# Patient Record
Sex: Female | Born: 1990 | Race: Black or African American | Hispanic: No | Marital: Single | Smoking: Current every day smoker
Health system: Southern US, Community
[De-identification: ages and names within clinical notes are randomized; demographics above are authoritative.]

---

## 2018-01-28 ENCOUNTER — Emergency Department (HOSPITAL_COMMUNITY)
Admission: EM | Admit: 2018-01-28 | Discharge: 2018-01-28 | Disposition: A | Discharge status: Home / Self Care | Payer: Self-pay | Attending: Emergency Medicine | Admitting: Emergency Medicine

## 2018-01-28 ENCOUNTER — Encounter (HOSPITAL_COMMUNITY): Payer: Self-pay | Admitting: Emergency Medicine

## 2018-01-28 DIAGNOSIS — N912 Amenorrhea, unspecified: Secondary | ICD-10-CM | POA: Insufficient documentation

## 2018-01-28 DIAGNOSIS — F17210 Nicotine dependence, cigarettes, uncomplicated: Secondary | ICD-10-CM | POA: Insufficient documentation

## 2018-01-28 LAB — I-STAT BETA HCG BLOOD, ED (MC, WL, AP ONLY)

## 2018-01-28 NOTE — ED Triage Notes (Signed)
Pt reports that she hasnt had a menstrual period since December. Reports that she always was regular before then. Reports only been sexually active with female partner.

## 2018-01-28 NOTE — Discharge Instructions (Signed)
Please follow up with OBGYN and/or a primary doctor - referrals have been provided

## 2018-01-28 NOTE — ED Provider Notes (Signed)
[facility]-EMERGENCY DEPT Provider Note   CSN: [id number] Arrival date & time: 01/28/18  1305     History   Chief Complaint Chief Complaint  Patient presents with  . Amenorrhea    HPI Sarah Thomas is a 27 y.o. female who presents with amenorrhea.  She states that for the past 3-4 months she has not had a period.  She had menarche at around age 45 and has had regular periods although they have been heavy.  She has never been on birth control for any reason.  She states she is a lesbian and is in a monogamous relationship with her female partner.  She has never had sex with males.  She has had some weight gain over the past year but attributes this to eating a lot more.  She does not do a significant amount of exercise.  She denies galactorrhea, hot flashes, issues with acne or hirsutism.  She has never had a Pap smear or seen OB/GYN because she states that she thought she did not need to because she is a lesbian.  She denies any pain.  HPI  History reviewed. No pertinent past medical history.  There are no active problems to display for this patient.   History reviewed. No pertinent surgical history.   OB History   None      Home Medications    Prior to Admission medications   Not on File    Family History No family history on file.  Social History Social History   Tobacco Use  . Smoking status: Current Every Day Smoker    Types: Cigars  . Smokeless tobacco: Never Used  Substance Use Topics  . Alcohol use: Not on file  . Drug use: Not on file     Allergies   Patient has no known allergies.   Review of Systems Review of Systems  Constitutional: Positive for unexpected weight change (Weight gain).  Gastrointestinal: Negative for abdominal pain.  Genitourinary: Positive for menstrual problem. Negative for dysuria, vaginal bleeding, vaginal discharge and vaginal pain.  All other systems reviewed and are negative.    Physical  Exam Updated Vital Signs BP 119/74 (BP Location: Left Arm)   Pulse 75   Temp 98.6 F (37 C) (Oral)   Resp 17   LMP 10/13/2017   SpO2 100%   Physical Exam  Constitutional: She is oriented to person, place, and time. She appears well-developed and well-nourished. No distress.  Overweight female in no acute distress  HENT:  Head: Normocephalic and atraumatic.  Eyes: Pupils are equal, round, and reactive to light. Conjunctivae are normal. Right eye exhibits no discharge. Left eye exhibits no discharge. No scleral icterus.  Neck: Normal range of motion.  Cardiovascular: Normal rate and regular rhythm.  Pulmonary/Chest: Effort normal and breath sounds normal. No respiratory distress.  Abdominal: Soft. Bowel sounds are normal. She exhibits no distension. There is no tenderness.  Neurological: She is alert and oriented to person, place, and time.  Skin: Skin is warm and dry.  Psychiatric: She has a normal mood and affect. Her behavior is normal.  Nursing note and vitals reviewed.    ED Treatments / Results  Labs (all labs ordered are listed, but only abnormal results are displayed) Labs Reviewed  I-STAT BETA HCG BLOOD, ED ([facility], [facility], [facility] ONLY)    EKG None  Radiology No results found.  Procedures Procedures (including critical care time)  Medications Ordered in ED Medications - No data to display  Initial Impression / Assessment and Plan / ED Course  I have reviewed the triage vital signs and the nursing notes.  Pertinent labs & imaging results that were available during my care of the patient were reviewed by me and considered in my medical decision making (see chart for details).  27 year old female presents with secondary amenorrhea for the past 3-4 months.  Her vital signs are normal.  She is well-appearing.  She has no obvious reason for secondary amenorrhea and her history although I do think she needs further testing with lab work and a Pap smear as an outpatient.  She  was given referrals to women's health and [facility] [facility] since she is uninsured.  She verbalized understanding and is in agreement with plan.  Final Clinical Impressions(s) / ED Diagnoses   Final diagnoses:  Amenorrhea    ED Discharge Orders    None       Bethel BornGekas, Demitris Pokorny Marie, PA-C 01/28/18 1538    Gwyneth SproutPlunkett, Whitney, MD 01/28/18 2056

## 2019-10-07 ENCOUNTER — Other Ambulatory Visit: Payer: Self-pay

## 2019-10-07 ENCOUNTER — Emergency Department (HOSPITAL_COMMUNITY)
Admission: EM | Admit: 2019-10-07 | Discharge: 2019-10-08 | Disposition: A | Discharge status: Home / Self Care | Payer: Self-pay | Attending: Emergency Medicine | Admitting: Emergency Medicine

## 2019-10-07 ENCOUNTER — Encounter (HOSPITAL_COMMUNITY): Payer: Self-pay | Admitting: Emergency Medicine

## 2019-10-07 DIAGNOSIS — K0889 Other specified disorders of teeth and supporting structures: Secondary | ICD-10-CM | POA: Insufficient documentation

## 2019-10-07 DIAGNOSIS — F17210 Nicotine dependence, cigarettes, uncomplicated: Secondary | ICD-10-CM | POA: Insufficient documentation

## 2019-10-07 MED ORDER — OXYCODONE-ACETAMINOPHEN 5-325 MG PO TABS
1.0000 | ORAL_TABLET | ORAL | Status: DC | PRN
  Administered 2019-10-07: 1 via ORAL
  Filled 2019-10-07: qty 1

## 2019-10-07 NOTE — ED Triage Notes (Signed)
Patient reports left upper and lower molar pain for 2 days , dental appointment tomorrow but can no longer wait .

## 2019-10-08 NOTE — ED Provider Notes (Signed)
[facility] EMERGENCY DEPARTMENT Provider Note   CSN: [id number] Arrival date & time: 10/07/19  2136     History   Chief Complaint Chief Complaint  Patient presents with  . Dental Pain    HPI Sarah Thomas is a 28 y.o. female.  Patient presents to the emergency department with a dental complaint. Symptoms began 2 days ago. The patient has tried to alleviate pain by not eating on it and taking pain meds in triage.  Pain rated as moderate to severe, characterized as throbbing in nature and located left upper and lower rear molars. Has a dentist appointment for tomorrow.     HPI  History reviewed. No pertinent past medical history.  There are no problems to display for this patient.   History reviewed. No pertinent surgical history.   OB History   No obstetric history on file.      Home Medications    Prior to Admission medications   Not on File    Family History No family history on file.  Social History Social History   Tobacco Use  . Smoking status: Current Every Day Smoker    Types: Cigars  . Smokeless tobacco: Never Used  Substance Use Topics  . Alcohol use: Never  . Drug use: Never     Allergies   Patient has no known allergies.   Review of Systems Review of Systems   Physical Exam Updated Vital Signs BP 99/71 (BP Location: Right Arm)   Pulse 65   Temp 98 F (36.7 C) (Oral)   Resp 16   LMP 09/30/2019   SpO2 100%   Physical Exam Physical Exam  Constitutional: Pt appears well-developed and well-nourished.  HENT:  Head: Normocephalic.  Mouth/Throat:  Crowding of left upper and lower rear molars No gingival swelling, fluctuance or induration No gross abscess  No sublingual edema, no sign of Ludwig's angina, or deep space infection Eyes: Conjunctivae are normal.  Neck: Normal range of motion. Neck supple.  No stridor Handling secretions without difficulty No nuchal rigidity No cervical  lymphadenopathy Pulmonary/Chest: Effort normal. No respiratory distress.  Equal chest rise  Neurological: Pt is alert and oriented x 4  Skin: Skin is warm and dry.  Psychiatric: Pt has a normal mood and affect.  Nursing note and vitals reviewed.   ED Treatments / Results  Labs (all labs ordered are listed, but only abnormal results are displayed) Labs Reviewed - No data to display  EKG    Radiology No results found.  Procedures Procedures (including critical care time)  Medications Ordered in ED Medications  oxyCODONE-acetaminophen (PERCOCET/ROXICET) 5-325 MG per tablet 1 tablet (1 tablet Oral Given 10/07/19 2152)     Initial Impression / Assessment and Plan / ED Course  I have reviewed the triage vital signs and the nursing notes.  Pertinent labs & imaging results that were available during my care of the patient were reviewed by me and considered in my medical decision making (see chart for details).        Patient with dentalgia.  No abscess requiring immediate incision and drainage.  Exam not concerning for Ludwig's angina or pharyngeal abscess. No evidence of infection. Pt instructed to follow-up with dentist.  Discussed return precautions. Pt safe for discharge.   Final Clinical Impressions(s) / ED Diagnoses   Final diagnoses:  Pain, dental    ED Discharge Orders    None       Roxy Horseman, PA-C 10/08/19 0021  Merryl Hacker, MD 10/08/19 [phone]

## 2019-10-12 ENCOUNTER — Emergency Department (HOSPITAL_COMMUNITY)
Admission: EM | Admit: 2019-10-12 | Discharge: 2019-10-12 | Disposition: A | Discharge status: Home / Self Care | Payer: Self-pay | Attending: Emergency Medicine | Admitting: Emergency Medicine

## 2019-10-12 ENCOUNTER — Other Ambulatory Visit: Payer: Self-pay

## 2019-10-12 ENCOUNTER — Emergency Department (HOSPITAL_COMMUNITY): Payer: Self-pay

## 2019-10-12 DIAGNOSIS — R55 Syncope and collapse: Secondary | ICD-10-CM | POA: Insufficient documentation

## 2019-10-12 DIAGNOSIS — R0789 Other chest pain: Secondary | ICD-10-CM | POA: Insufficient documentation

## 2019-10-12 DIAGNOSIS — F17210 Nicotine dependence, cigarettes, uncomplicated: Secondary | ICD-10-CM | POA: Insufficient documentation

## 2019-10-12 LAB — CBC
HCT: 48.6 % — ABNORMAL HIGH (ref 36.0–46.0)
Hemoglobin: 15.4 g/dL — ABNORMAL HIGH (ref 12.0–15.0)
MCH: 28.3 pg (ref 26.0–34.0)
MCHC: 31.7 g/dL (ref 30.0–36.0)
MCV: 89.3 fL (ref 80.0–100.0)
Platelets: 337 10*3/uL (ref 150–400)
RBC: 5.44 MIL/uL — ABNORMAL HIGH (ref 3.87–5.11)
RDW: 15.1 % (ref 11.5–15.5)
WBC: 14.4 10*3/uL — ABNORMAL HIGH (ref 4.0–10.5)
nRBC: 0 % (ref 0.0–0.2)

## 2019-10-12 LAB — BASIC METABOLIC PANEL
Anion gap: 9 (ref 5–15)
BUN: 7 mg/dL (ref 6–20)
CO2: 24 mmol/L (ref 22–32)
Calcium: 8.4 mg/dL — ABNORMAL LOW (ref 8.9–10.3)
Chloride: 107 mmol/L (ref 98–111)
Creatinine, Ser: 0.82 mg/dL (ref 0.44–1.00)
GFR calc Af Amer: >60 mL/min (ref >60)
GFR calc non Af Amer: >60 mL/min (ref >60)
Glucose, Bld: 116 mg/dL — ABNORMAL HIGH (ref 70–99)
Potassium: 4.2 mmol/L (ref 3.5–5.1)
Sodium: 140 mmol/L (ref 135–145)

## 2019-10-12 LAB — I-STAT BETA HCG BLOOD, ED (MC, WL, AP ONLY): I-stat hCG, quantitative: <5 m[IU]/mL (ref <5)

## 2019-10-12 LAB — TROPONIN I (HIGH SENSITIVITY)
Troponin I (High Sensitivity): <2 ng/L (ref <18)
Troponin I (High Sensitivity): <2 ng/L (ref <18)

## 2019-10-12 LAB — CBG MONITORING, ED: Glucose-Capillary: 106 mg/dL — ABNORMAL HIGH (ref 70–99)

## 2019-10-12 MED ORDER — SODIUM CHLORIDE 0.9% FLUSH: 3.0000 mL | Freq: Once | INTRAVENOUS | Status: DC

## 2019-10-12 MED ORDER — SODIUM CHLORIDE 0.9 % IV BOLUS
1000.0000 mL | Freq: Once | INTRAVENOUS | Status: AC
  Administered 2019-10-12: 1000 mL via INTRAVENOUS

## 2019-10-12 NOTE — ED Notes (Signed)
Pt states when she stood up while I was doing the orthostatic VS she started having chest tightness. I'm notifying Dr. Zenia Resides

## 2019-10-12 NOTE — ED Provider Notes (Signed)
[place] EMERGENCY DEPARTMENT Provider Note   CSN: [id number] Arrival date & time: 10/12/19  1857     History Chief Complaint  Patient presents with  . Near Syncope  . Chest Pain    Sarah Thomas is a 28 y.o. female.  The history is provided by the patient. No language interpreter was used.  Near Syncope Associated symptoms include chest pain.  Chest Pain Associated symptoms: near-syncope        28 year old female sent here from the plasma center for evaluation of near syncope.  Patient report earlier in the day she went to the plasma center to donate plasma.  She gave approximately a pint.  She report when the return of blood, she developed lightheadedness, felt flushed, and believes she may passed out for approximately 5 seconds.  She was kept at the facility for a short period of time and subsequently discharged after she drank Gatorade.  However upon leaving, she still feels lightheadedness and decided to come to the ER for evaluation.  She did complain of some mild chest tightness however at this time she is back to her normal baseline.  She does not have any active chest pain, shortness of breath, headache, neck pain, abdominal pain, focal numbness or focal weakness.  Denies any prior history of PE or DVT, currently not on any birth control, no recent surgery or active cancer.  No leg swelling or calf pain.  No past medical history on file.  There are no problems to display for this patient.   No past surgical history on file.   OB History   No obstetric history on file.     No family history on file.  Social History   Tobacco Use  . Smoking status: Current Every Day Smoker    Types: Cigars  . Smokeless tobacco: Never Used  Substance Use Topics  . Alcohol use: Never  . Drug use: Never    Home Medications Prior to Admission medications   Not on File    Allergies    Patient has no known allergies.  Review of Systems   Review of  Systems  Cardiovascular: Positive for chest pain and near-syncope.  All other systems reviewed and are negative.   Physical Exam Updated Vital Signs BP 116/63   Pulse 86   Temp 98 F (36.7 C)   Resp 20   Ht 5\' 4"  (1.626 m)   Wt 83.9 kg   LMP 09/30/2019   SpO2 100%   BMI 31.76 kg/m   Physical Exam Vitals and nursing note reviewed.  Constitutional:      General: She is not in acute distress.    Appearance: She is well-developed.  HENT:     Head: Atraumatic.  Eyes:     Conjunctiva/sclera: Conjunctivae normal.  Cardiovascular:     Rate and Rhythm: Normal rate and regular rhythm.     Heart sounds: Normal heart sounds.  Musculoskeletal:     Cervical back: Neck supple.  Skin:    Findings: No rash.  Neurological:     Mental Status: She is alert.     ED Results / Procedures / Treatments   Labs (all labs ordered are listed, but only abnormal results are displayed) Labs Reviewed  BASIC METABOLIC PANEL - Abnormal; Notable for the following components:      Result Value   Glucose, Bld 116 (*)    Calcium 8.4 (*)    All other components within normal limits  CBC -  Abnormal; Notable for the following components:   WBC 14.4 (*)    RBC 5.44 (*)    Hemoglobin 15.4 (*)    HCT 48.6 (*)    All other components within normal limits  CBG MONITORING, ED - Abnormal; Notable for the following components:   Glucose-Capillary 106 (*)    All other components within normal limits  I-STAT BETA HCG BLOOD, ED ([facility], [facility], [facility] ONLY)  TROPONIN I (HIGH SENSITIVITY)  TROPONIN I (HIGH SENSITIVITY)    EKG EKG Interpretation  Date/Time:  Saturday October 12 2019 18:59:47 EST Ventricular Rate:  82 PR Interval:  160 QRS Duration: 78 QT Interval:  368 QTC Calculation: 429 R Axis:   35 Text Interpretation: Normal sinus rhythm with sinus arrhythmia Cannot rule out Anterior infarct , age undetermined Abnormal ECG No old tracing to compare Confirmed by Lorre Nick ([id number]) on 10/12/2019  9:28:07 PM   ED ECG REPORT   Date: 10/12/2019  Rate: 82  Rhythm: normal sinus rhythm and sinus arrhythmia  QRS Axis: normal  Intervals: normal  ST/T Wave abnormalities: nonspecific ST changes  Conduction Disutrbances:none  Narrative Interpretation:   Old EKG Reviewed: none available  I have personally reviewed the EKG tracing and agree with the computerized printout as noted.   Radiology DG Chest 2 View  Result Date: 10/12/2019 CLINICAL DATA:  Acute chest pain today. EXAM: CHEST - 2 VIEW COMPARISON:  None. FINDINGS: The cardiomediastinal silhouette is unremarkable. There is no evidence of focal airspace disease, pulmonary edema, suspicious pulmonary nodule/mass, pleural effusion, or pneumothorax. No acute bony abnormalities are identified. IMPRESSION: No active cardiopulmonary disease. Electronically Signed   By: Harmon Pier M.D.   On: 10/12/2019 19:41    Procedures Procedures (including critical care time)  Medications Ordered in ED Medications  sodium chloride flush (NS) 0.9 % injection 3 mL (has no administration in time range)    ED Course  I have reviewed the triage vital signs and the nursing notes.  Pertinent labs & imaging results that were available during my care of the patient were reviewed by me and considered in my medical decision making (see chart for details).    MDM Rules/Calculators/A&P                      BP 116/63   Pulse 86   Temp 98 F (36.7 C)   Resp 20   Ht 5\' 4"  (1.626 m)   Wt 83.9 kg   LMP 09/30/2019   SpO2 100%   BMI 31.76 kg/m   Final Clinical Impression(s) / ED Diagnoses Final diagnoses:  Near syncope    Rx / DC Orders ED Discharge Orders    None     8:46 PM Patient here for near-syncope after donating plasma earlier today.  At this time patient is resting comfortably in no acute discomfort.  She has normal orthostatic vital signs however she did report feeling lightheadedness.  She is PERC negative, low suspicion for PE.   Mild elevated white count of 14.4 likely stress demargination.  Hemoglobin is 15.4 no evidence of significant anemia.  Normal CBG.  Pregnancy test is negative.  EKG without concerning arrhythmia.  Since patient is mildly symptomatic, will give IV fluid and monitor.  10:33 PM Pt felt much better.  Stable for discharge. Return precaution given.    14/04/2019, PA-C 10/12/19 2233    [id number], MD 10/13/19 [phone]

## 2019-10-12 NOTE — ED Triage Notes (Signed)
Pt coming from plasma center where she was donating plasma and started to have chest tightness and felt faint like she was going to pass out. Pt walked into ED and had a near syncopal episode, pt helped into wheelchair. Pt a.o, resp e.u at this time.

## 2021-04-15 IMAGING — DX DG CHEST 2V
2 series · 2 of 2 positions shown · non-contrast
Comparison: None.

CLINICAL DATA: Acute chest pain today.

EXAM:
CHEST - 2 VIEW

[w chest lat]
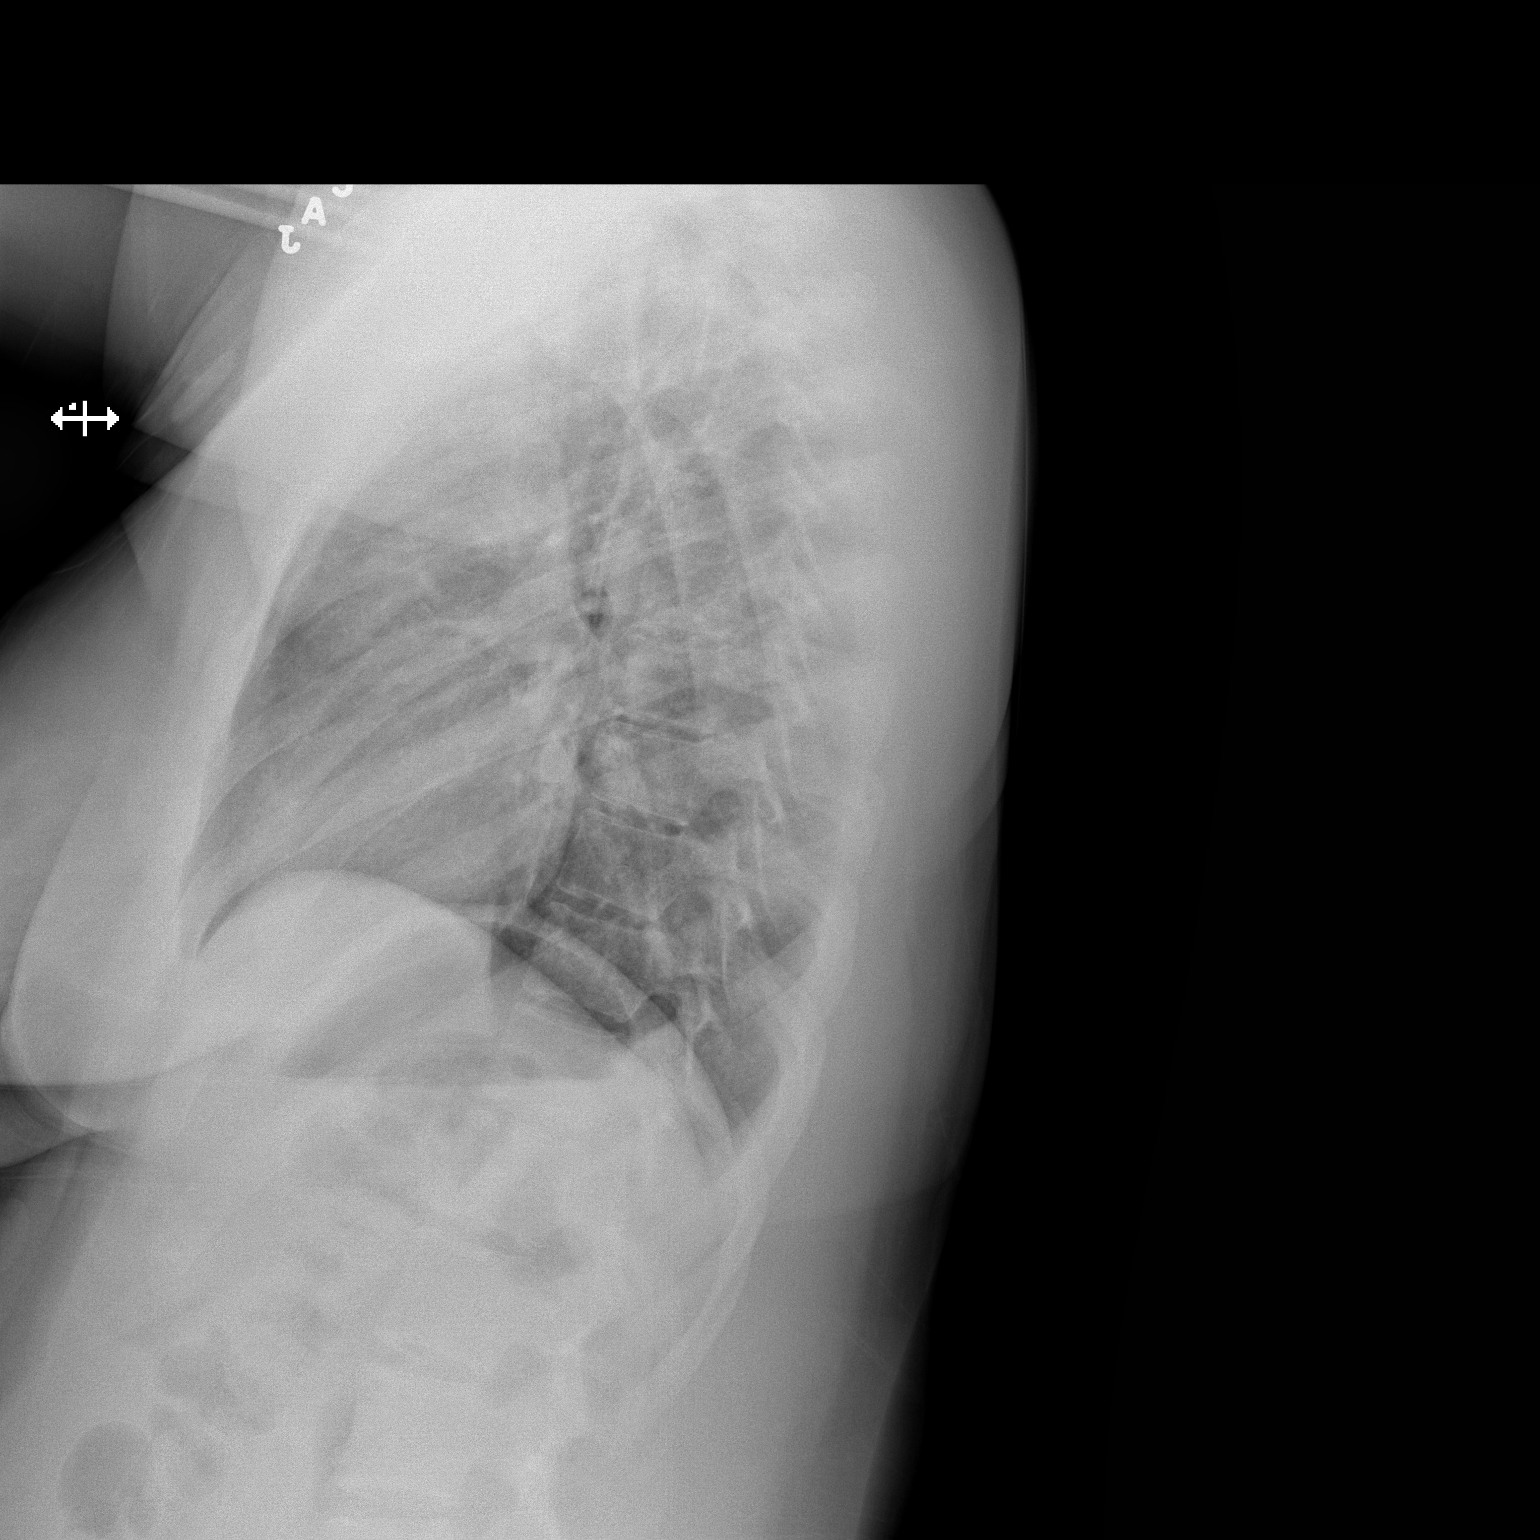

[w chest pa]
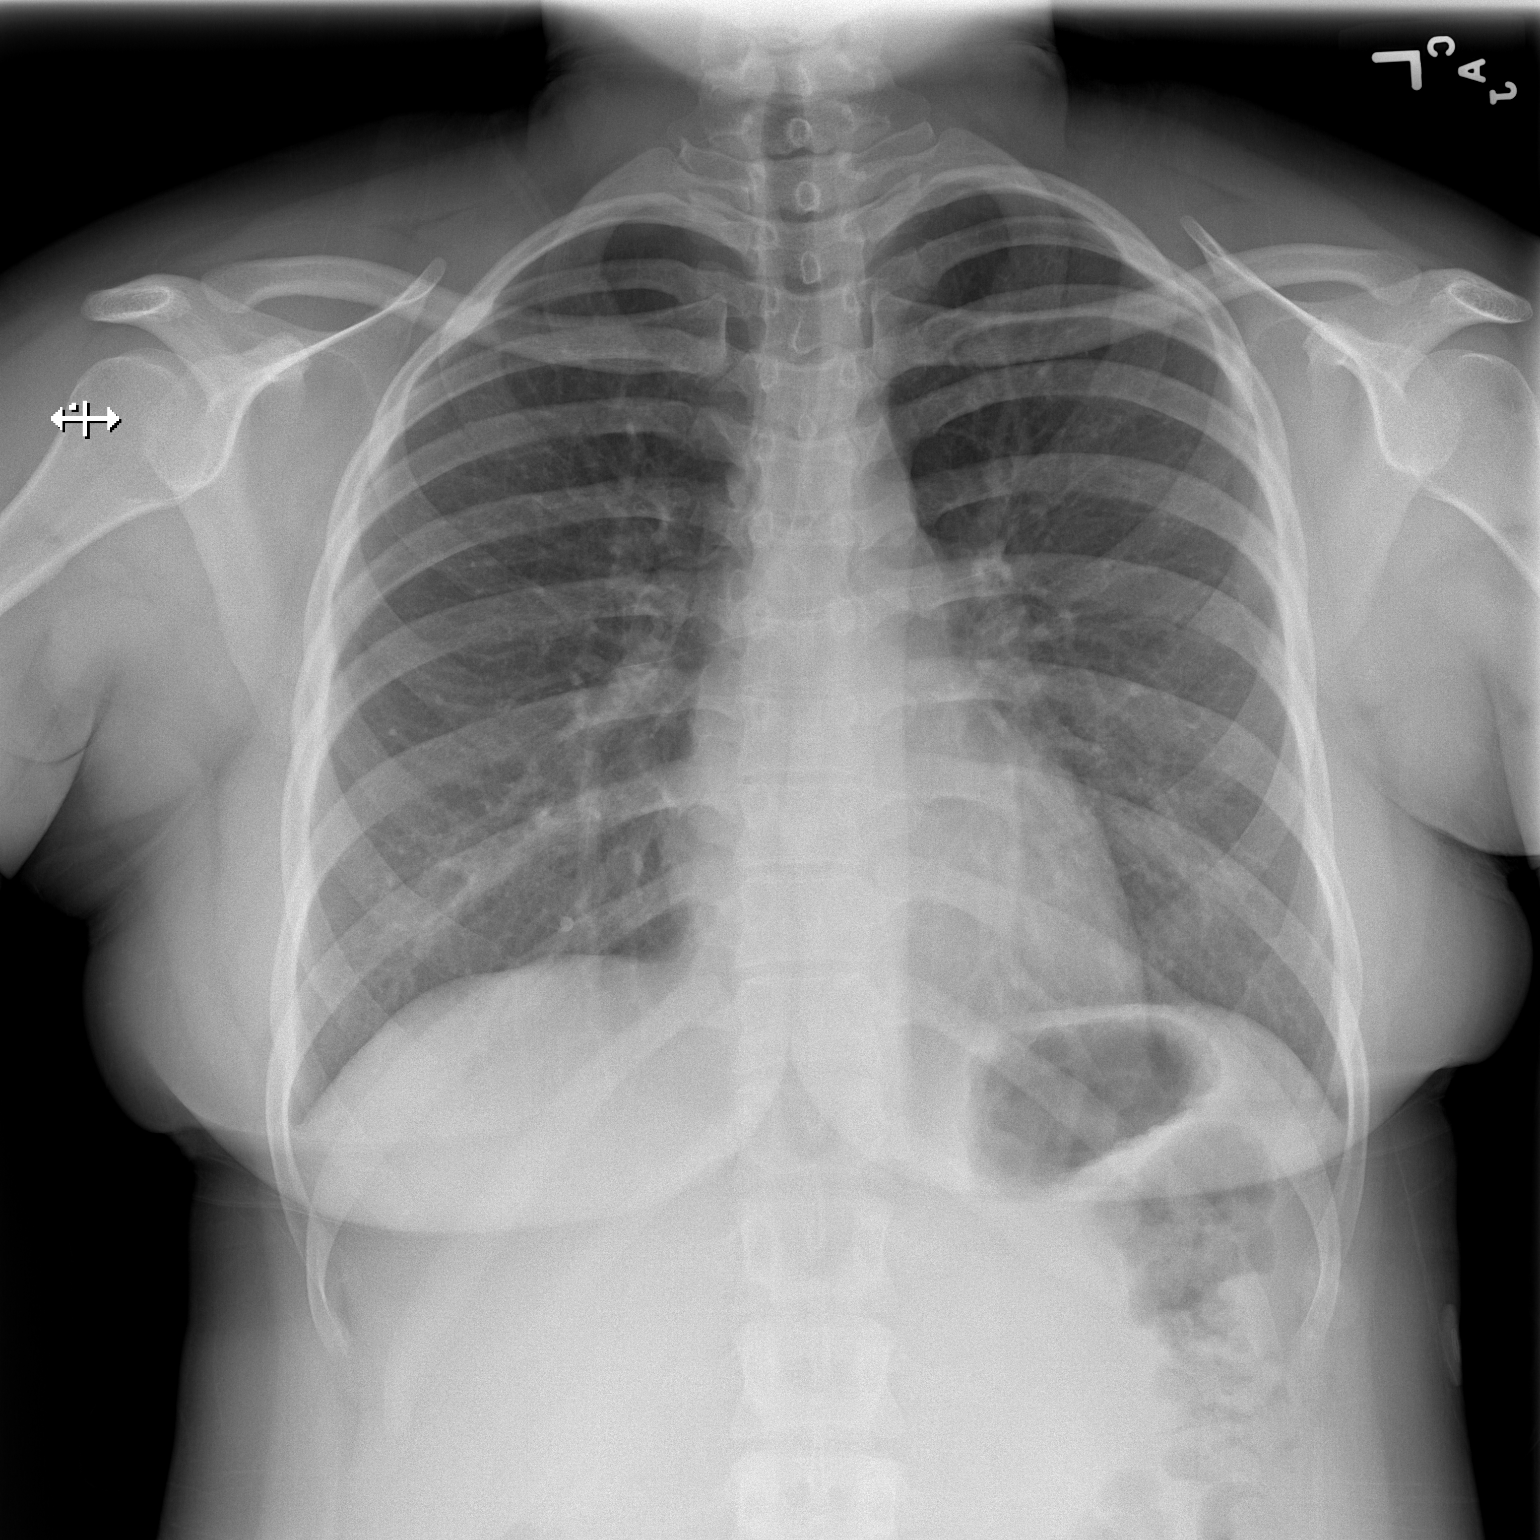

[2 of 2 positions shown; findings below may reference images not displayed]

FINDINGS: The cardiomediastinal silhouette is unremarkable.

There is no evidence of focal airspace disease, pulmonary edema,
suspicious pulmonary nodule/mass, pleural effusion, or pneumothorax.

No acute bony abnormalities are identified.
IMPRESSION: No active cardiopulmonary disease.
